# Patient Record
Sex: Female | Born: 1960 | Race: White | Hispanic: No | Marital: Single | Smoking: Current every day smoker
Health system: Southern US, Community
[De-identification: ages and names within clinical notes are randomized; demographics above are authoritative.]

## PROBLEM LIST (undated history)

## (undated) HISTORY — PX: ABDOMINAL HYSTERECTOMY: SHX81

---

## 2005-08-01 ENCOUNTER — Emergency Department (HOSPITAL_COMMUNITY): Admission: EM | Admit: 2005-08-01 | Discharge: 2005-08-01 | Payer: Self-pay | Admitting: Emergency Medicine

## 2005-10-17 ENCOUNTER — Emergency Department (HOSPITAL_COMMUNITY): Admission: EM | Admit: 2005-10-17 | Discharge: 2005-10-17 | Payer: Self-pay | Admitting: Emergency Medicine

## 2006-03-27 ENCOUNTER — Emergency Department (HOSPITAL_COMMUNITY): Admission: EM | Admit: 2006-03-27 | Discharge: 2006-03-27 | Payer: Self-pay | Admitting: Emergency Medicine

## 2006-06-22 ENCOUNTER — Emergency Department (HOSPITAL_COMMUNITY): Admission: EM | Admit: 2006-06-22 | Discharge: 2006-06-23 | Payer: Self-pay | Admitting: Emergency Medicine

## 2006-06-25 ENCOUNTER — Emergency Department (HOSPITAL_COMMUNITY): Admission: EM | Admit: 2006-06-25 | Discharge: 2006-06-25 | Payer: Self-pay | Admitting: Emergency Medicine

## 2006-08-18 ENCOUNTER — Emergency Department (HOSPITAL_COMMUNITY): Admission: AD | Admit: 2006-08-18 | Discharge: 2006-08-18 | Payer: Self-pay | Admitting: Family Medicine

## 2006-08-23 ENCOUNTER — Emergency Department (HOSPITAL_COMMUNITY): Admission: EM | Admit: 2006-08-23 | Discharge: 2006-08-23 | Payer: Self-pay | Admitting: Family Medicine

## 2006-09-24 ENCOUNTER — Emergency Department (HOSPITAL_COMMUNITY): Admission: EM | Admit: 2006-09-24 | Discharge: 2006-09-24 | Payer: Self-pay | Admitting: Family Medicine

## 2006-11-17 ENCOUNTER — Emergency Department (HOSPITAL_COMMUNITY): Admission: EM | Admit: 2006-11-17 | Discharge: 2006-11-18 | Payer: Self-pay | Admitting: Emergency Medicine

## 2007-01-03 ENCOUNTER — Emergency Department (HOSPITAL_COMMUNITY): Admission: EM | Admit: 2007-01-03 | Discharge: 2007-01-03 | Payer: Self-pay | Admitting: Emergency Medicine

## 2007-01-26 ENCOUNTER — Emergency Department (HOSPITAL_COMMUNITY): Admission: EM | Admit: 2007-01-26 | Discharge: 2007-01-26 | Payer: Self-pay | Admitting: Emergency Medicine

## 2007-01-30 ENCOUNTER — Emergency Department (HOSPITAL_COMMUNITY): Admission: EM | Admit: 2007-01-30 | Discharge: 2007-01-30 | Payer: Self-pay | Admitting: Emergency Medicine

## 2007-03-22 ENCOUNTER — Emergency Department (HOSPITAL_COMMUNITY): Admission: EM | Admit: 2007-03-22 | Discharge: 2007-03-22 | Payer: Self-pay | Admitting: Emergency Medicine

## 2007-08-24 IMAGING — US US ABDOMEN COMPLETE
1 series · 14 of 25 positions shown · non-contrast
Comparison: None.

ABDOMEN ULTRASOUND:

CLINICAL DATA: Stomach pain
TECHNIQUE: Complete abdominal ultrasound examination was performed including
evaluation of the liver, gallbladder, bile ducts, pancreas, kidneys, spleen,
IVC, and abdominal aorta.

[Series 1: unknown · 0.34mm/px · 14 of 56 slices shown]
[im 1/56]
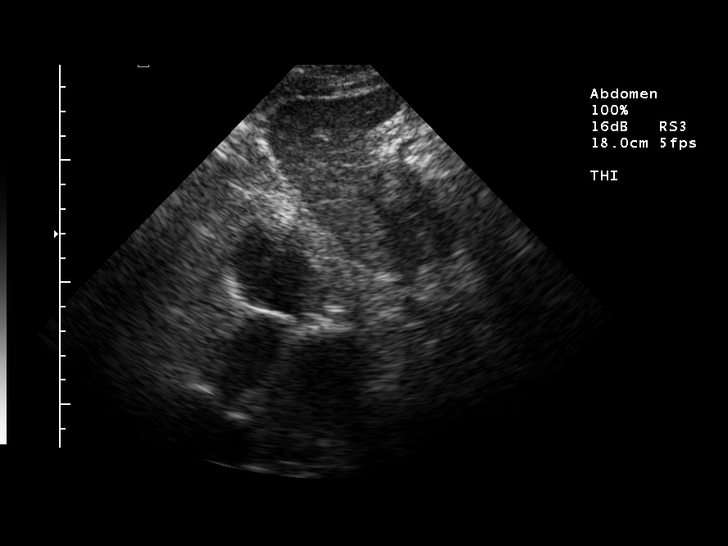
[im 5/56]
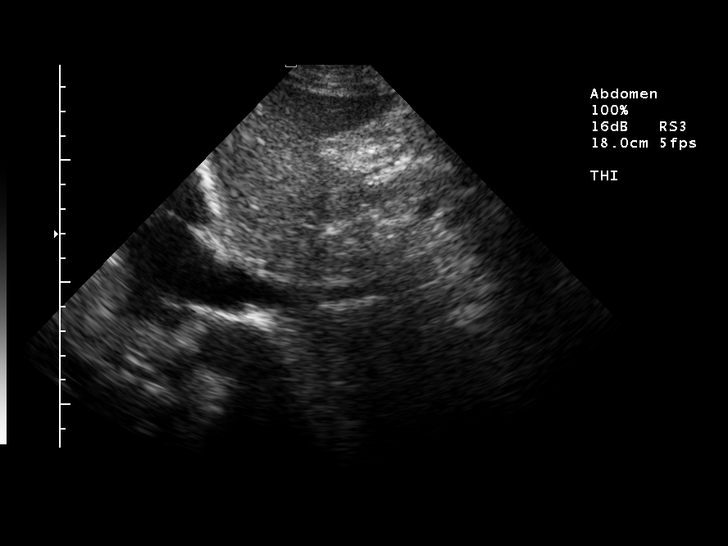
[im 10/56]
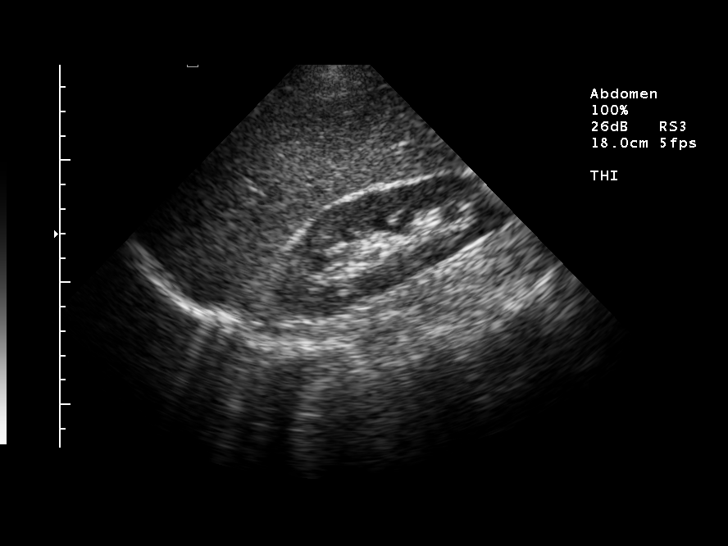
[im 14/56]
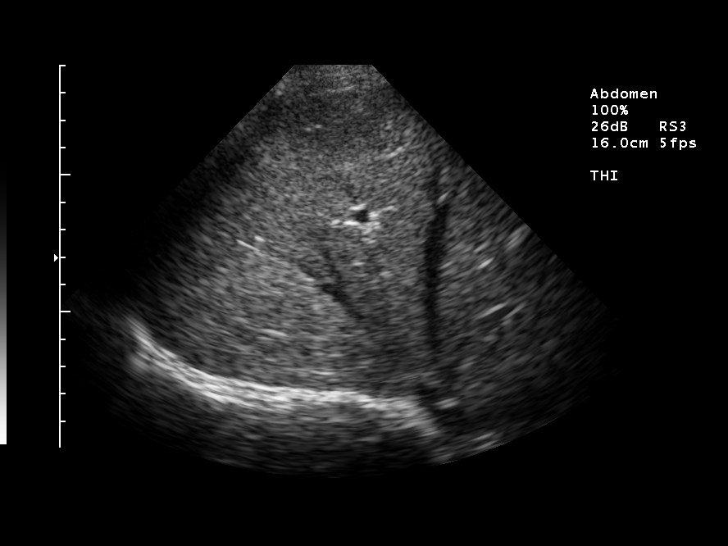
[im 19/56]
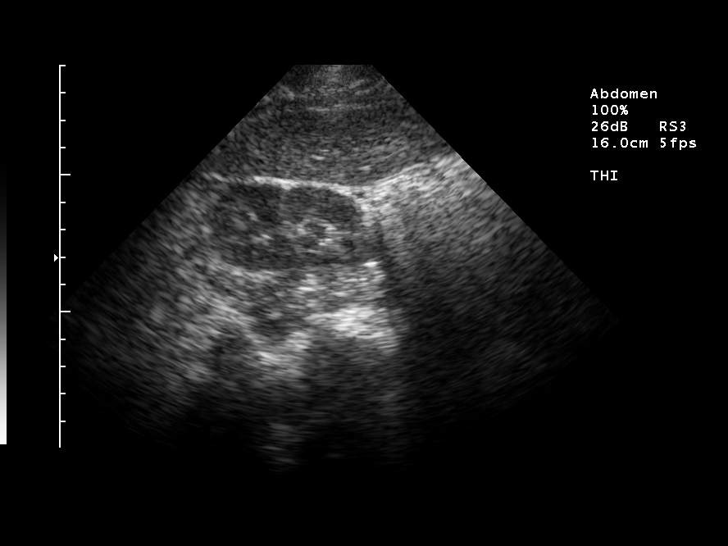
[im 21/56]
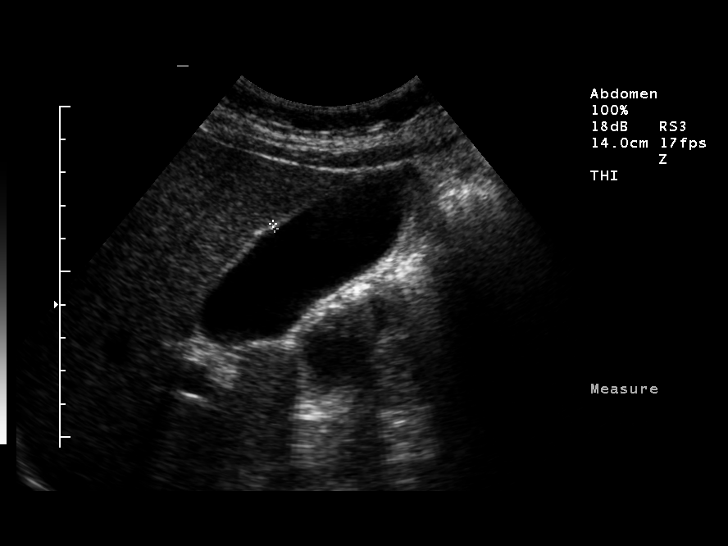
[im 26/56]
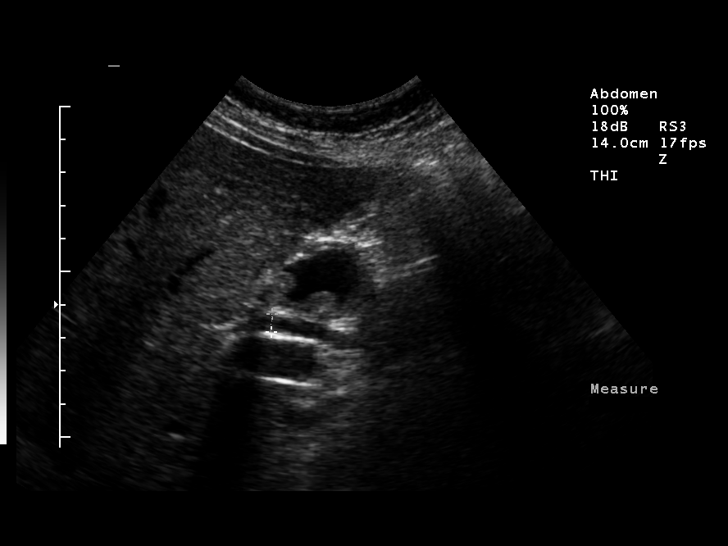
[im 30/56]
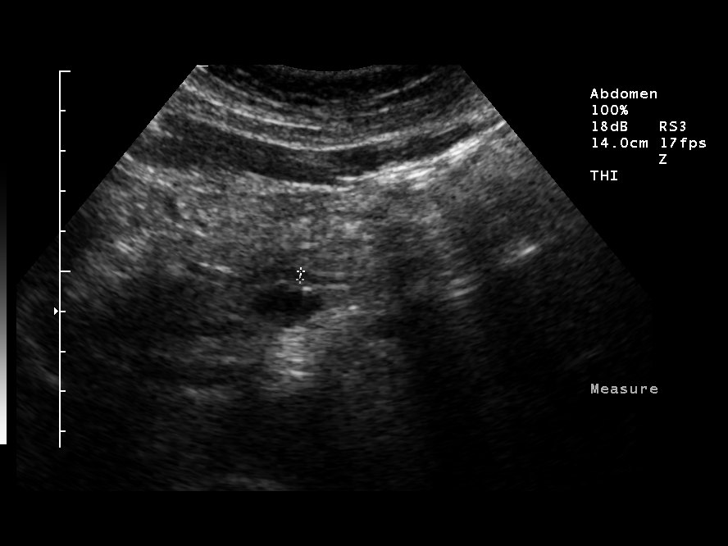
[im 35/56]
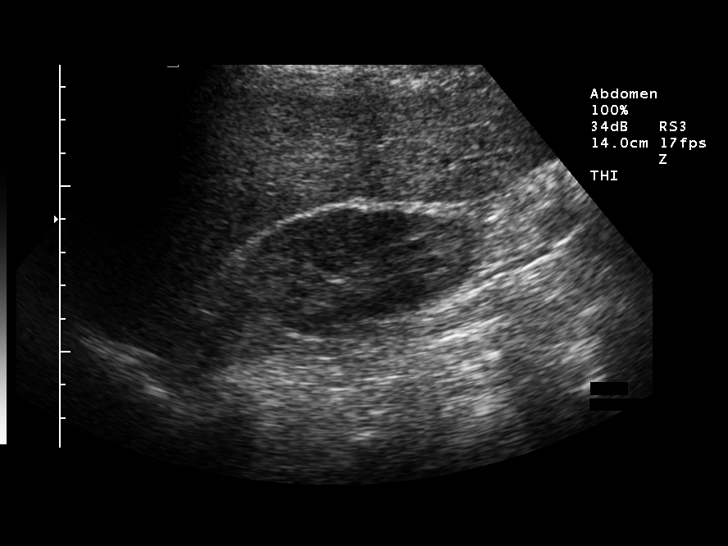
[im 37/56]
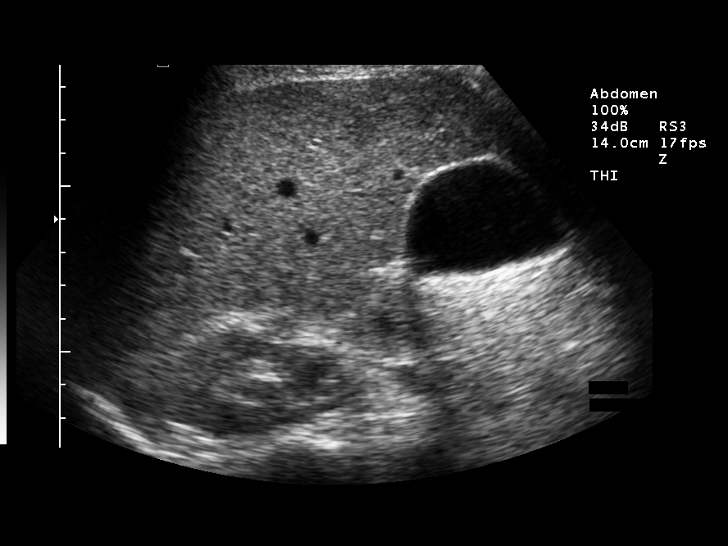
[im 42/56]
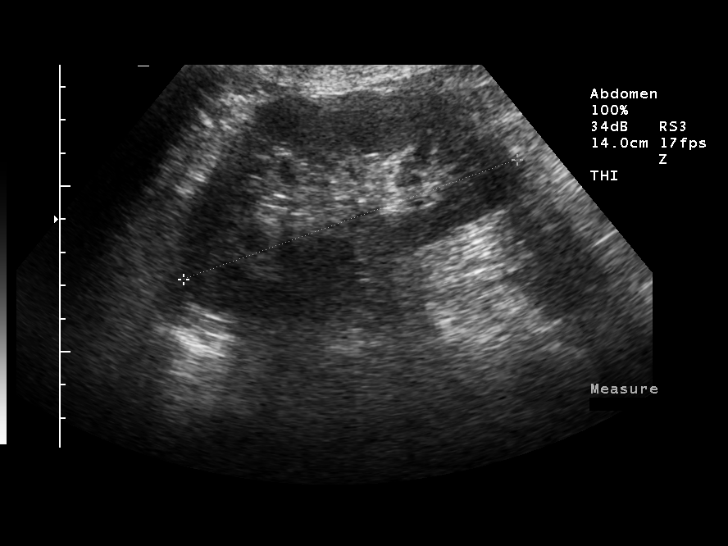
[im 46/56]
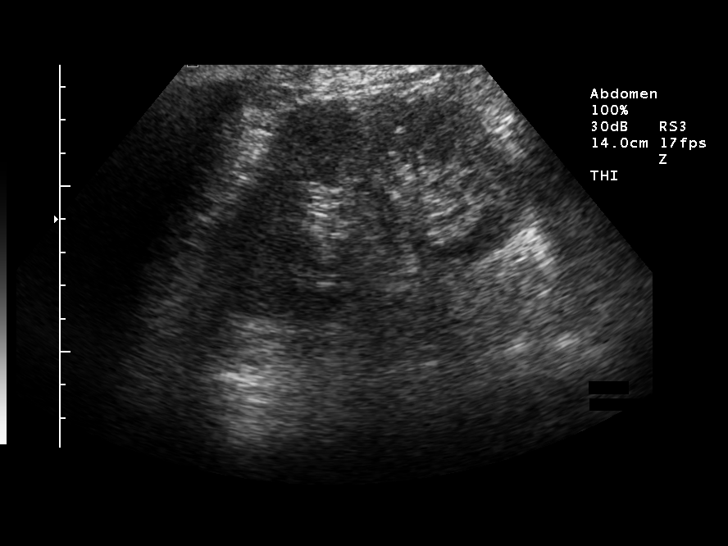
[im 51/56]
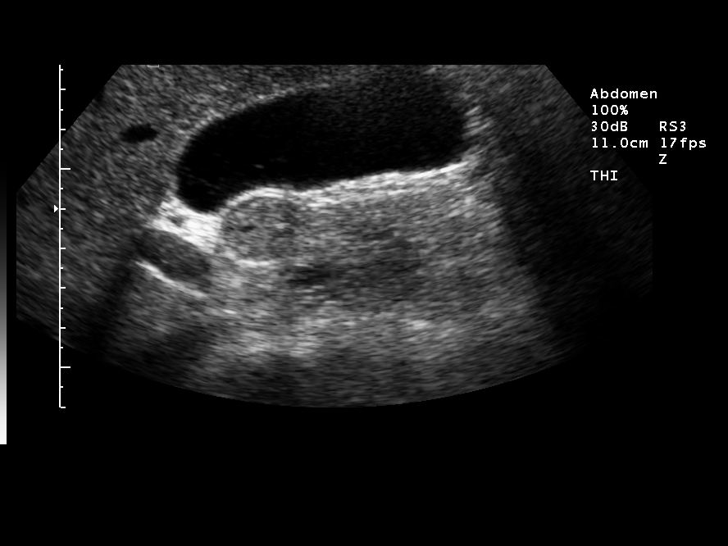
[im 56/56]
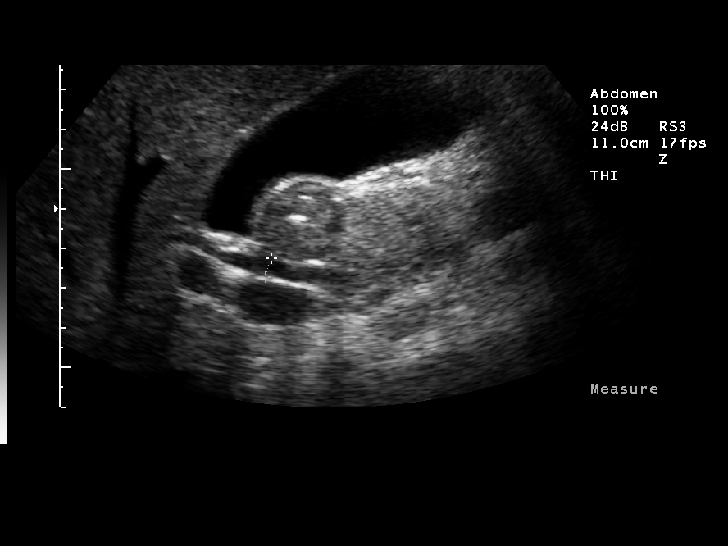

[14 of 25 positions shown; findings below may reference images not displayed]

FINDINGS: Gallbladder: Normal. Specifically, there is no evidence for gallstones,
gallbladder wall thickening or pericholecystic fluid.

Common Bile Duct:  Nondilated

Liver:  Normal

Inferior Vena Cava:  Normal

Pancreas:  Main pancreatic duct is mildly distended a 2-3 mm. The pancreatic
head and tail are not well visualized.

Spleen:  Normal

Right Kidney:  11.1 cm in long axis.  Normal

Left Kidney:  11.0 cm in long axis.  Normal

Aorta:  Limited visualization without evidence for aneurysm.
IMPRESSION: Prominence of the main pancreatic duct with nonvisualization of the pancreatic
head. There is no associated biliary dilatation. Correlation with serum
chemistries may prove helpful . Clinical correlation will be required. Consider
followup pancreatic MRI to exclude a pancreatic head lesion.

## 2010-10-01 ENCOUNTER — Encounter: Payer: Self-pay | Admitting: General Surgery

## 2013-12-12 ENCOUNTER — Encounter (HOSPITAL_COMMUNITY): Payer: Self-pay | Admitting: Emergency Medicine

## 2013-12-12 ENCOUNTER — Emergency Department (HOSPITAL_COMMUNITY)
Admission: EM | Admit: 2013-12-12 | Discharge: 2013-12-12 | Disposition: A | Discharge status: Home / Self Care | Payer: Self-pay | Attending: Emergency Medicine | Admitting: Emergency Medicine

## 2013-12-12 DIAGNOSIS — Z880 Allergy status to penicillin: Secondary | ICD-10-CM | POA: Insufficient documentation

## 2013-12-12 DIAGNOSIS — B029 Zoster without complications: Secondary | ICD-10-CM | POA: Insufficient documentation

## 2013-12-12 DIAGNOSIS — F17200 Nicotine dependence, unspecified, uncomplicated: Secondary | ICD-10-CM | POA: Insufficient documentation

## 2013-12-12 MED ORDER — FAMCICLOVIR 500 MG PO TABS: 500.0000 mg | ORAL_TABLET | Freq: Three times a day (TID) | ORAL | Status: AC

## 2013-12-12 MED ORDER — PREDNISONE 20 MG PO TABS: ORAL_TABLET | ORAL | Status: AC

## 2013-12-12 MED ORDER — OXYCODONE-ACETAMINOPHEN 5-325 MG PO TABS: 2.0000 | ORAL_TABLET | ORAL | Status: AC | PRN

## 2013-12-12 NOTE — ED Notes (Signed)
Pt c/o painful blistered rash to rt shoulder since yesterday.  Pt states it feels similar to the last time she had shingles.

## 2013-12-12 NOTE — ED Provider Notes (Signed)
CSN: [id number]     Arrival date & time 12/12/13  4098 History   First MD Initiated Contact with Patient 12/12/13 [phone]     Chief Complaint  Patient presents with  . Rash     (Consider location/radiation/quality/duration/timing/severity/associated sxs/prior Treatment) HPI 53 year old healthy female with history of shingles in the past states she has starting yesterday all day and all night and continuing this morning a constant severe burning skin pain to the left-sided upper chest and shoulder burning pain with blister noted today just like prior shingles; she has no fever no cough no pleuritic pain no exertional pain does not feel internal chest pain states this is a chest wall skin rash pain just like prior shingles; denies abdominal pain vomiting or other concerns. There is no treatment prior to arrival. She smokes and was advised to discontinue. She stopped alcohol usage 2 months ago and denies illicit drug usage. History reviewed. No pertinent past medical history. Past Surgical History  Procedure Laterality Date  . Abdominal hysterectomy     History reviewed. No pertinent family history. History  Substance Use Topics  . Smoking status: Current Every Day Smoker  . Smokeless tobacco: Not on file  . Alcohol Use: No   OB History   Grav Para Term Preterm Abortions TAB SAB Ect Mult Living                 Review of Systems  10 Systems reviewed and are negative for acute change except as noted in the HPI.  Allergies  Codeine and Penicillins  Home Medications   Current Outpatient Rx  Name  Route  Sig  Dispense  Refill  . famciclovir (FAMVIR) 500 MG tablet   Oral   Take 1 tablet (500 mg total) by mouth 3 (three) times daily.   21 tablet   0   . oxyCODONE-acetaminophen (PERCOCET) 5-325 MG per tablet   Oral   Take 2 tablets by mouth every 4 (four) hours as needed.   30 tablet   0   . predniSONE (DELTASONE) 20 MG tablet      3 tabs po day one, then 2 tabs daily x 4  days   11 tablet   0    BP 142/91  Pulse 98  Temp(Src) 98.7 F (37.1 C) (Oral)  Resp 18  Ht 5\' 5"  (1.651 m)  Wt 140 lb (63.504 kg)  BMI 23.30 kg/m2  SpO2 98% Physical Exam  Nursing note and vitals reviewed. Constitutional:  Awake, alert, nontoxic appearance.  HENT:  Head: Atraumatic.  Eyes: Right eye exhibits no discharge. Left eye exhibits no discharge.  Neck: Neck supple.  Cardiovascular: Normal rate and regular rhythm.   No murmur heard. Pulmonary/Chest: Effort normal and breath sounds normal. No respiratory distress. She has no wheezes. She has no rales. She exhibits no tenderness.  Abdominal: Soft. Bowel sounds are normal. She exhibits no distension. There is no tenderness. There is no rebound and no guarding.  Musculoskeletal: She exhibits no tenderness.  Baseline ROM, no obvious new focal weakness.  Neurological: She is alert.  Mental status and motor strength appears baseline for patient and situation.  Skin: Rash noted.  Isolated vesical left shoulder with unilateral dermatomal skin pain consistent with Zoster  Psychiatric: She has a normal mood and affect.    ED Course  Procedures (including critical care time) Patient informed of clinical course, understand medical decision-making process, and agree with plan. Labs Review Labs Reviewed - No data to display Imaging  Review No results found.   EKG Interpretation None      MDM   Final diagnoses:  Zoster    I doubt any other EMC precluding discharge at this time including, but not necessarily limited to the following:ACS, PE, PNA.    Hurman HornJohn M Mali Eppard, MD 12/12/13 [phone]

## 2013-12-12 NOTE — Discharge Instructions (Signed)
Shingles Shingles is caused by the same virus that causes chickenpox. The first feelings may be pain or tingling. A rash will follow in a couple days. The rash may occur on any area of the body. Long-lasting pain is more likely in an elderly person. It can last months to years. There are medicines that can help prevent pain if you start taking them early. HOME CARE   Place cool cloths on the rash.  Only take medicine as told by your doctor.  You may use calamine lotion to relieve itchy skin.  Avoid touching:  Babies.  Children with inflamed skin (eczema).  People who have gotten transplanted organs.  People with chronic illnesses, such as leukemia and AIDS.  If the rash is on the face, you may need to see a specialist. Keep all appointments. Shingles must be kept away from the eyes, if possible.  Keep all appointments.  Avoid touching the eyes or eye area, if possible. GET HELP RIGHT AWAY IF:   You have any pain on the face or eye.  Your medicines do not help.  Your redness or puffiness (swelling) spreads.  You have a fever.  You notice any red lines going away from the rash area.  You develop generalized rash, shortness of breath, confusion, headache, vomiting, or other concerns. MAKE SURE YOU:   Understand these instructions.  Will watch your condition.  Will get help right away if you are not doing well or get worse. Document Released: 02/13/2008 Document Revised: 11/19/2011 Document Reviewed: 02/13/2008 [facility] Patient Information 2014 [place] [employer], [place].
